# Patient Record
Sex: Female | Born: 1995 | Hispanic: No | Marital: Single | Smoking: Never smoker
Health system: Southern US, Community
[De-identification: ages and names within clinical notes are randomized; demographics above are authoritative.]

---

## 2021-05-06 ENCOUNTER — Encounter (HOSPITAL_COMMUNITY): Payer: Self-pay | Admitting: Emergency Medicine

## 2021-05-06 ENCOUNTER — Emergency Department (HOSPITAL_COMMUNITY): Payer: Medicaid Other

## 2021-05-06 ENCOUNTER — Emergency Department (HOSPITAL_COMMUNITY)
Admission: EM | Admit: 2021-05-06 | Discharge: 2021-05-07 | Disposition: A | Discharge status: Home / Self Care | Payer: Medicaid Other | Attending: Obstetrics and Gynecology | Admitting: Obstetrics and Gynecology

## 2021-05-06 ENCOUNTER — Other Ambulatory Visit: Payer: Self-pay

## 2021-05-06 DIAGNOSIS — O26891 Other specified pregnancy related conditions, first trimester: Secondary | ICD-10-CM

## 2021-05-06 DIAGNOSIS — O23591 Infection of other part of genital tract in pregnancy, first trimester: Secondary | ICD-10-CM | POA: Diagnosis not present

## 2021-05-06 DIAGNOSIS — N760 Acute vaginitis: Secondary | ICD-10-CM

## 2021-05-06 DIAGNOSIS — O99891 Other specified diseases and conditions complicating pregnancy: Secondary | ICD-10-CM | POA: Diagnosis not present

## 2021-05-06 DIAGNOSIS — R110 Nausea: Secondary | ICD-10-CM

## 2021-05-06 DIAGNOSIS — N898 Other specified noninflammatory disorders of vagina: Secondary | ICD-10-CM

## 2021-05-06 DIAGNOSIS — R109 Unspecified abdominal pain: Secondary | ICD-10-CM | POA: Diagnosis not present

## 2021-05-06 DIAGNOSIS — B9689 Other specified bacterial agents as the cause of diseases classified elsewhere: Secondary | ICD-10-CM

## 2021-05-06 DIAGNOSIS — R102 Pelvic and perineal pain: Secondary | ICD-10-CM | POA: Diagnosis not present

## 2021-05-06 DIAGNOSIS — R1031 Right lower quadrant pain: Secondary | ICD-10-CM | POA: Diagnosis not present

## 2021-05-06 DIAGNOSIS — O210 Mild hyperemesis gravidarum: Secondary | ICD-10-CM | POA: Insufficient documentation

## 2021-05-06 DIAGNOSIS — Z3A09 9 weeks gestation of pregnancy: Secondary | ICD-10-CM

## 2021-05-06 DIAGNOSIS — O219 Vomiting of pregnancy, unspecified: Secondary | ICD-10-CM

## 2021-05-06 LAB — WET PREP, GENITAL
Sperm: NONE SEEN
Trich, Wet Prep: NONE SEEN
Yeast Wet Prep HPF POC: NONE SEEN

## 2021-05-06 LAB — CBC
HCT: 40.4 % (ref 36.0–46.0)
Hemoglobin: 14 g/dL (ref 12.0–15.0)
MCH: 29.1 pg (ref 26.0–34.0)
MCHC: 34.7 g/dL (ref 30.0–36.0)
MCV: 84 fL (ref 80.0–100.0)
Platelets: 367 10*3/uL (ref 150–400)
RBC: 4.81 MIL/uL (ref 3.87–5.11)
RDW: 13 % (ref 11.5–15.5)
WBC: 14.4 10*3/uL — ABNORMAL HIGH (ref 4.0–10.5)
nRBC: 0 % (ref 0.0–0.2)

## 2021-05-06 LAB — COMPREHENSIVE METABOLIC PANEL
ALT: 26 U/L (ref 0–44)
AST: 24 U/L (ref 15–41)
Albumin: 3.4 g/dL — ABNORMAL LOW (ref 3.5–5.0)
Alkaline Phosphatase: 100 U/L (ref 38–126)
Anion gap: 8 (ref 5–15)
BUN: 6 mg/dL (ref 6–20)
CO2: 24 mmol/L (ref 22–32)
Calcium: 9.2 mg/dL (ref 8.9–10.3)
Chloride: 101 mmol/L (ref 98–111)
Creatinine, Ser: 0.51 mg/dL (ref 0.44–1.00)
GFR, Estimated: >60 mL/min (ref >60)
Glucose, Bld: 93 mg/dL (ref 70–99)
Potassium: 4.1 mmol/L (ref 3.5–5.1)
Sodium: 133 mmol/L — ABNORMAL LOW (ref 135–145)
Total Bilirubin: 0.5 mg/dL (ref 0.3–1.2)
Total Protein: 6.4 g/dL — ABNORMAL LOW (ref 6.5–8.1)

## 2021-05-06 LAB — I-STAT BETA HCG BLOOD, ED (MC, WL, AP ONLY): I-stat hCG, quantitative: >2000 m[IU]/mL — ABNORMAL HIGH (ref <5)

## 2021-05-06 MED ORDER — DOXYLAMINE-PYRIDOXINE 10-10 MG PO TBEC: 2.0000 | DELAYED_RELEASE_TABLET | Freq: Every evening | ORAL | 1 refills | Status: DC | PRN

## 2021-05-06 MED ORDER — METRONIDAZOLE 500 MG PO TABS: 500.0000 mg | ORAL_TABLET | Freq: Two times a day (BID) | ORAL | 0 refills | Status: AC

## 2021-05-06 NOTE — ED Provider Notes (Signed)
Emergency Medicine Provider OB Triage Evaluation Note  Virginia Greene is a 25 y.o. female, No obstetric history on file., at Unknown gestation who presents to the emergency department with complaints of vaginal dc and pain. She believes she is 2 months pregnant. Positive home test.  Review of  Systems  Positive: pelvic pain Negative: fever  Physical Exam  There were no vitals taken for this visit. General: Awake, no distress  HEENT: Atraumatic  Resp: Normal effort  Cardiac: Normal rate Abd: Nondistended, nontender  MSK: Moves all extremities without difficulty Neuro: Speech clear  Medical Decision Making  Pt evaluated for pregnancy concern and is stable for transfer to MAU. Pt is in agreement with plan for transfer.  8:00 PM Discussed with MAU APP, Thalia Bloodgood, who accepts patient in transfer.  Clinical Impression  No diagnosis found.  HCG pos- sending to MAU   Arthor Captain, PA-C 05/06/21 2109    Melene Plan, DO 05/06/21 2204

## 2021-05-06 NOTE — MAU Note (Signed)
Pt instructed in getting vag swabs and obtained them without difficulty.

## 2021-05-06 NOTE — ED Notes (Signed)
Report given to MAU RN for transfer , patient evaluated by PA at triage .

## 2021-05-06 NOTE — Discharge Instructions (Signed)
[facility] Providers   [facility]             [address], [place], [place] [address] [phone]  [facility]                                                             [address], [address], [place], [place], [address] [phone]  [facility]                                    [address], [address], , [place], [address] [phone]  [facility] [place] [address], [place], [place], [address] [phone]  [facility]                                 [address], [place], [place], [address] [phone]  [facility]                                    [address], [place], [place], [address] [phone]  [facility] [address], [address], [place], [place], [address]                              [facility] [address], [place], [place], [address] [phone]  [facility]         Phone: [phone]  [facility]      Phone: [phone]   [facility]      Phone: [phone]  [facility]         Phone: [phone]   [facility]    Phone: [phone]  [facility]      Phone: [phone]  [facility]     Phone: [phone]  [facility]        Phone: [phone]  [facility]      Phone: [phone]  

## 2021-05-06 NOTE — ED Triage Notes (Signed)
Patient reports mid abdominal  pain this evening , no emesis or diarrhea , she is approx. 2 months pregnant , no vaginal bleeding , positive home preg test .

## 2021-05-06 NOTE — MAU Note (Signed)
Wynelle Bourgeois CNM in Triage to discuss test results and d/c plan with pt. Pt then d/c home from Triage by CNM

## 2021-05-06 NOTE — MAU Provider Note (Signed)
Chief Complaint: Abdominal Pian / Pregnant   None    SUBJECTIVE HPI: Virginia Greene is a 25 y.o. G2P1001 at [redacted]w[redacted]d by LMP who presents to maternity admissions reporting pain in right abdomen which has now gone away.  Had the pain for several hours and then it resolved.  Has had some nausea. . She denies vaginal bleeding, vaginal itching/burning, urinary symptoms, h/a, dizziness, or fever/chills.    Abdominal Pain This is a new problem. The current episode started today. The most recent episode lasted 3 hours. The problem has been resolved. The pain is located in the RLQ. The pain is mild. The quality of the pain is cramping. The abdominal pain does not radiate. Associated symptoms include nausea. Pertinent negatives include no constipation, diarrhea, dysuria, frequency, headaches, myalgias or vomiting. Nothing aggravates the pain. The pain is relieved by Nothing. She has tried nothing for the symptoms.  RN Note: Pt came to [facility] ED having abd pain mid R abdomen for 3 hrs. Pain comes and goes and is sharp. Some n/v but not diarrhea. Denies VB. Some white vag d/c.   History reviewed. No pertinent past medical history. History reviewed. No pertinent surgical history. Social History   Socioeconomic History   Marital status: Single    Spouse name: Not on file   Number of children: Not on file   Years of education: Not on file   Highest education level: Not on file  Occupational History   Not on file  Tobacco Use   Smoking status: Never   Smokeless tobacco: Not on file  Substance and Sexual Activity   Alcohol use: Never   Drug use: Never   Sexual activity: Not on file  Other Topics Concern   Not on file  Social History Narrative   Not on file   Social Determinants of Health   Financial Resource Strain: Not on file  Food Insecurity: Not on file  Transportation Needs: Not on file  Physical Activity: Not on file  Stress: Not on file  Social Connections: Not on file  Intimate  Partner Violence: Not on file   No current facility-administered medications on file prior to encounter.   No current outpatient medications on file prior to encounter.   No Known Allergies  I have reviewed patient's Past Medical Hx, Surgical Hx, Family Hx, Social Hx, medications and allergies.   ROS:  Review of Systems  Gastrointestinal:  Positive for abdominal pain and nausea. Negative for constipation, diarrhea and vomiting.  Genitourinary:  Negative for dysuria and frequency.  Musculoskeletal:  Negative for myalgias.  Neurological:  Negative for headaches.  Review of Systems  Other systems negative   Physical Exam  Physical Exam Patient Vitals for the past 24 hrs:  BP Temp Pulse Resp Height Weight  05/06/21 2136 130/80 -- 96 -- -- --  05/06/21 2133 -- 98.4 F (36.9 C) -- 16 4\' 11"  (1.499 m) 61.7 kg  05/06/21 2001 -- -- -- -- 4\' 11"  (1.499 m) 70 kg   Constitutional: Well-developed, well-nourished female in no acute distress.  Cardiovascular: normal rate Respiratory: normal effort GI: Abd soft, non-tender.  MS: Extremities nontender, no edema, normal ROM Neurologic: Alert and oriented x 4.  GU: Neg CVAT.  PELVIC EXAM: deferred, patient's pain resolved and she was managed from triage due to high patient volume on unit  LAB RESULTS Results for orders placed or performed during the hospital encounter of 05/06/21 (from the past 24 hour(s))  I-Stat beta hCG blood, ED  Status: Abnormal   Collection Time: 05/06/21  8:34 PM  Result Value Ref Range   I-stat hCG, quantitative >2,000.0 (H) <5 mIU/mL   Comment 3          CBC     Status: Abnormal   Collection Time: 05/06/21  9:50 PM  Result Value Ref Range   WBC 14.4 (H) 4.0 - 10.5 K/uL   RBC 4.81 3.87 - 5.11 MIL/uL   Hemoglobin 14.0 12.0 - 15.0 g/dL   HCT 32.4 40.1 - 02.7 %   MCV 84.0 80.0 - 100.0 fL   MCH 29.1 26.0 - 34.0 pg   MCHC 34.7 30.0 - 36.0 g/dL   RDW 25.3 66.4 - 40.3 %   Platelets 367 150 - 400 K/uL   nRBC  0.0 0.0 - 0.2 %  hCG, quantitative, pregnancy     Status: Abnormal   Collection Time: 05/06/21  9:50 PM  Result Value Ref Range   hCG, Beta Chain, Sharene Butters, S 47,425 (H) <5 mIU/mL  ABO/Rh     Status: None   Collection Time: 05/06/21  9:50 PM  Result Value Ref Range   ABO/RH(D)      B POS Performed at [facility], [address] [place], [place] [address]   Comprehensive metabolic panel     Status: Abnormal   Collection Time: 05/06/21  9:50 PM  Result Value Ref Range   Sodium 133 (L) 135 - 145 mmol/L   Potassium 4.1 3.5 - 5.1 mmol/L   Chloride 101 98 - 111 mmol/L   CO2 24 22 - 32 mmol/L   Glucose, Bld 93 70 - 99 mg/dL   BUN 6 6 - 20 mg/dL   Creatinine, Ser 7.56 0.44 - 1.00 mg/dL   Calcium 9.2 8.9 - 43.3 mg/dL   Total Protein 6.4 (L) 6.5 - 8.1 g/dL   Albumin 3.4 (L) 3.5 - 5.0 g/dL   AST 24 15 - 41 U/L   ALT 26 0 - 44 U/L   Alkaline Phosphatase 100 38 - 126 U/L   Total Bilirubin 0.5 0.3 - 1.2 mg/dL   GFR, Estimated >29 >51 mL/min   Anion gap 8 5 - 15  Wet prep, genital     Status: Abnormal   Collection Time: 05/06/21 11:19 PM  Result Value Ref Range   Yeast Wet Prep HPF POC NONE SEEN NONE SEEN   Trich, Wet Prep NONE SEEN NONE SEEN   Clue Cells Wet Prep HPF POC PRESENT (A) NONE SEEN   WBC, Wet Prep HPF POC MANY (A) NONE SEEN   Sperm NONE SEEN     --/--/B POS Performed at [facility], [address] [place], [place] [address]  [phone])  IMAGING US OB Comp Less 14 Wks  Result Date: 05/06/2021 CLINICAL DATA:  Pelvic pain EXAM: OBSTETRIC <14 WK ULTRASOUND TECHNIQUE: Transabdominal ultrasound was performed for evaluation of the gestation as well as the maternal uterus and adnexal regions. COMPARISON:  None. FINDINGS: Intrauterine gestational sac: Single Yolk sac:  Visualized. Embryo:  Visualized. Cardiac Activity: Visualized. Heart Rate: 180 bpm CRL: 24.9 mm   9 w 1 d                  [place] EDC: 12/08/2021 Subchorionic hemorrhage:  None visualized. Maternal  uterus/adnexae: Left ovary measures 1.3 x 2.7 x 1.4 cm and the right ovary measures 2.5 x 3.8 x 2.4 cm. No adnexal masses or free fluid. IMPRESSION: 1. Single live intrauterine pregnancy  as above, estimated age 10 weeks and 1 day. Electronically Signed   By: Sharlet Salina M.D.   On: 05/06/2021 23:23    MAU Management/MDM: Ordered usual first trimester r/o ectopic labs.   cultures done Will check baseline Ultrasound to rule out ectopic.  This bleeding/pain can represent a normal pregnancy with bleeding, spontaneous abortion or even an ectopic which can be life-threatening.  The process as listed above helps to determine which of these is present.  Reviewed results with patient Discussed prenatal care, wishes to go to [facility]   ASSESSMENT 1. Pelvic pain affecting pregnancy in first trimester, antepartum   2.    Single IUP at [redacted]w[redacted]d 3.     Nausea 4.     Bacterial vaginosis  PLAN Discharge home Rx Diclegis for nausea Rx Flagyl for BV Info for prenatal Care providers given  Pt stable at time of discharge. Encouraged to return here if she develops worsening of symptoms, increase in pain, fever, or other concerning symptoms.    Wynelle Bourgeois CNM, MSN Certified Nurse-Midwife 05/06/2021  11:50 PM

## 2021-05-06 NOTE — MAU Note (Signed)
Pt has here 21mo old daughter with her. Informed the patient the baby is not to be in the hospital per Covid policy. She states she can get the baby's father to pick up the baby. Pt did ask "so I will have to be in the hospital by myself?" Blood drawn and then pt will work on contacting the father

## 2021-05-06 NOTE — MAU Note (Signed)
Pt came to [facility] ED having abd pain mid R abdomen for 3 hrs. Pain comes and goes and is sharp. Some n/v but not diarrhea. Denies VB. Some white vag d/c.

## 2021-05-07 LAB — GC/CHLAMYDIA PROBE AMP (~~LOC~~) NOT AT ARMC
Chlamydia: NEGATIVE
Comment: NEGATIVE
Comment: NORMAL
Neisseria Gonorrhea: NEGATIVE

## 2021-05-07 LAB — ABO/RH: ABO/RH(D): B POS

## 2021-05-07 LAB — HCG, QUANTITATIVE, PREGNANCY: hCG, Beta Chain, Quant, S: 68873 m[IU]/mL — ABNORMAL HIGH (ref <5)

## 2021-07-06 ENCOUNTER — Ambulatory Visit (INDEPENDENT_AMBULATORY_CARE_PROVIDER_SITE_OTHER): Payer: Medicaid Other

## 2021-07-06 DIAGNOSIS — Z3A00 Weeks of gestation of pregnancy not specified: Secondary | ICD-10-CM

## 2021-07-06 DIAGNOSIS — Z3490 Encounter for supervision of normal pregnancy, unspecified, unspecified trimester: Secondary | ICD-10-CM

## 2021-07-06 DIAGNOSIS — Z3480 Encounter for supervision of other normal pregnancy, unspecified trimester: Secondary | ICD-10-CM

## 2021-07-06 MED ORDER — BLOOD PRESSURE KIT DEVI: 1.0000 | 0 refills | Status: AC | PRN

## 2021-07-06 MED ORDER — GOJJI WEIGHT SCALE MISC
1.0000 | 0 refills | Status: AC | PRN
Start: 2021-07-06 — End: ?

## 2021-07-06 NOTE — Progress Notes (Addendum)
..  New OB Intake  I connected with  Virginia Greene on 07/06/21 at  9:00 AM EDT by telephone Video Visit and verified that I am speaking with the correct person using two identifiers. Nurse is located at [facility] and pt is located at home.  I discussed the limitations, risks, security and privacy concerns of performing an evaluation and management service by telephone and the availability of in person appointments. I also discussed with the patient that there may be a patient responsible charge related to this service. The patient expressed understanding and agreed to proceed.  I explained I am completing New OB Intake today. We discussed her EDD of 12/10/2021 that is based on LMP of 03/05/21. Pt is G2/P1. I reviewed her allergies, medications, Medical/Surgical/OB history, and appropriate screenings. I informed her of [facility] services. Based on history, this is a/an  pregnancy uncomplicated .   Patient Active Problem List   Diagnosis Date Noted   Supervision of other normal pregnancy, antepartum 07/06/2021    Concerns addressed today  Delivery Plans:  Plans to deliver at [facility].   MyChart/Babyscripts MyChart access verified. I explained pt will have some visits in office and some virtually. Babyscripts instructions given and order placed. Patient verifies receipt of registration text/e-mail. Account successfully created and app downloaded.  Blood Pressure Cuff  Blood pressure cuff ordered for patient to pick-up from [employer]. Explained after first prenatal appt pt will check weekly and document in Babyscripts.  Weight scale: Patient does not have weight scale. Weight scale ordered for patient to pick up form [facility].   Anatomy US Explained first scheduled [place] will be around 19 weeks. Anatomy US appt to be determined.  Labs Discussed Avelina Laine genetic screening with patient. Would like both Panorama and Horizon drawn at new OB visit. Routine prenatal labs needed.  Covid  Vaccine Patient has covid vaccine.   Mother/ Baby Dyad Candidate?    If yes, offer as possibility  Informed patient of [facility] website and placed link in her AVS.   Social Determinants of Health Food Insecurity: Patient denies food insecurity. WIC Referral: Patient is not interested in referral to [facility].  Transportation: Patient denies transportation needs. Childcare: Discussed no children allowed at ultrasound appointments. Offered childcare services; patient declines childcare services at this time. Pt declined the need for counseling services based on PHQ-9.  Send link to Pregnancy Navigators   Placed OB Box on problem list and updated  First visit review I reviewed new OB appt with pt. I explained she will have a pelvic exam, ob bloodwork with genetic screening, and PAP smear. Explained pt will be seen by Dr. Clearance Coots at first visit; encounter routed to appropriate provider. Explained that patient will be seen by pregnancy navigator following visit with provider. [facility] information placed in AVS.   Katrina Stack, RN 07/06/2021  9:04 AM

## 2021-07-06 NOTE — Progress Notes (Signed)
Patient was assessed and managed by nursing staff during this encounter. I have reviewed the chart and agree with the documentation and plan. I have also made any necessary editorial changes.  Loras Grieshop A Krisha Beegle, MD 07/06/2021 12:21 PM   

## 2021-07-13 ENCOUNTER — Ambulatory Visit (INDEPENDENT_AMBULATORY_CARE_PROVIDER_SITE_OTHER): Payer: Medicaid Other | Admitting: Obstetrics

## 2021-07-13 ENCOUNTER — Other Ambulatory Visit (HOSPITAL_COMMUNITY)
Admission: RE | Admit: 2021-07-13 | Discharge: 2021-07-13 | Disposition: A | Discharge status: Home / Self Care | Payer: Medicaid Other | Source: Ambulatory Visit | Attending: Obstetrics | Admitting: Obstetrics

## 2021-07-13 ENCOUNTER — Other Ambulatory Visit: Payer: Self-pay

## 2021-07-13 ENCOUNTER — Encounter: Payer: Self-pay | Admitting: Obstetrics

## 2021-07-13 VITALS — BP 142/80 | HR 119 | Wt 138.0 lb

## 2021-07-13 DIAGNOSIS — Z3480 Encounter for supervision of other normal pregnancy, unspecified trimester: Secondary | ICD-10-CM | POA: Insufficient documentation

## 2021-07-13 MED ORDER — VALACYCLOVIR HCL 500 MG PO TABS: 500.0000 mg | ORAL_TABLET | ORAL | 11 refills | Status: DC | PRN

## 2021-07-13 MED ORDER — VITAFOL ULTRA 29-0.6-0.4-200 MG PO CAPS: 1.0000 | ORAL_CAPSULE | Freq: Every day | ORAL | 4 refills | Status: AC

## 2021-07-13 NOTE — Progress Notes (Signed)
Subjective:  Virginia Greene is a 25 y.o. G2P1001 at [redacted]w[redacted]d being seen today for ongoing prenatal care.  She is currently monitored for the following issues for this low-risk pregnancy and has Supervision of other normal pregnancy, antepartum on their problem list.  Patient reports no complaints.  Contractions: Not present. Vag. Bleeding: None.  Movement: Present. Denies leaking of fluid.   The following portions of the patient's history were reviewed and updated as appropriate: allergies, current medications, past family history, past medical history, past social history, past surgical history and problem list. Problem list updated.  Objective:   Vitals:   07/13/21 1032  BP: (!) 142/80  Pulse: (!) 119    Fetal Status:     Movement: Present     General:  Alert, oriented and cooperative. Patient is in no acute distress.  Skin: Skin is warm and dry. No rash noted.   Cardiovascular: Normal heart rate noted  Respiratory: Normal respiratory effort, no problems with respiration noted  Abdomen: Soft, gravid, appropriate for gestational age. Pain/Pressure: Present     Pelvic:  Cervical exam deferred        Extremities: Normal range of motion.  Edema: None  Mental Status: Normal mood and affect. Normal behavior. Normal judgment and thought content.   Urinalysis:      Assessment and Plan:  Pregnancy: G2P1001 at [redacted]w[redacted]d  1. Supervision of other normal pregnancy, antepartum Rx: - Cytology - PAP( [place]) - Cervicovaginal ancillary only( [place]) - Obstetric Panel, Including HIV - Hepatitis C antibody - Culture, OB Urine - valACYclovir (VALTREX) 500 MG tablet; Take 1 tablet (500 mg total) by mouth as needed.  Dispense: 30 tablet; Refill: 11 - Genetic Screening    Preterm labor symptoms and general obstetric precautions including but not limited to vaginal bleeding, contractions, leaking of fluid and fetal movement were reviewed in detail with the patient. Please refer to After  Visit Summary for other counseling recommendations.   Return in about 4 weeks (around 08/10/2021) for ROB at [facility] office, per patient request.   Brock Bad, MD  07/13/21

## 2021-07-14 LAB — OBSTETRIC PANEL, INCLUDING HIV
Antibody Screen: NEGATIVE
Basophils Absolute: 0 10*3/uL (ref 0.0–0.2)
Basos: 0 %
EOS (ABSOLUTE): 0 10*3/uL (ref 0.0–0.4)
Eos: 0 %
HIV Screen 4th Generation wRfx: NONREACTIVE
Hematocrit: 37 % (ref 34.0–46.6)
Hemoglobin: 12.9 g/dL (ref 11.1–15.9)
Hepatitis B Surface Ag: NEGATIVE
Immature Grans (Abs): 0 10*3/uL (ref 0.0–0.1)
Immature Granulocytes: 0 %
Lymphocytes Absolute: 2.6 10*3/uL (ref 0.7–3.1)
Lymphs: 19 %
MCH: 29.1 pg (ref 26.6–33.0)
MCHC: 34.9 g/dL (ref 31.5–35.7)
MCV: 84 fL (ref 79–97)
Monocytes Absolute: 0.4 10*3/uL (ref 0.1–0.9)
Monocytes: 3 %
Neutrophils Absolute: 10.4 10*3/uL — ABNORMAL HIGH (ref 1.4–7.0)
Neutrophils: 78 %
Platelets: 397 10*3/uL (ref 150–450)
RBC: 4.43 x10E6/uL (ref 3.77–5.28)
RDW: 13.1 % (ref 11.7–15.4)
RPR Ser Ql: NONREACTIVE
Rh Factor: POSITIVE
Rubella Antibodies, IGG: 2.36 index (ref >0.99)
WBC: 13.5 10*3/uL — ABNORMAL HIGH (ref 3.4–10.8)

## 2021-07-14 LAB — CERVICOVAGINAL ANCILLARY ONLY
Bacterial Vaginitis (gardnerella): NEGATIVE
Candida Glabrata: NEGATIVE
Candida Vaginitis: NEGATIVE
Chlamydia: NEGATIVE
Comment: NEGATIVE
Comment: NEGATIVE
Comment: NEGATIVE
Comment: NEGATIVE
Comment: NEGATIVE
Comment: NORMAL
Neisseria Gonorrhea: NEGATIVE
Trichomonas: NEGATIVE

## 2021-07-14 LAB — HEPATITIS C ANTIBODY: Hep C Virus Ab: <0.1 s/co ratio (ref 0.0–0.9)

## 2021-07-15 LAB — CYTOLOGY - PAP: Diagnosis: NEGATIVE

## 2021-07-18 LAB — URINE CULTURE, OB REFLEX

## 2021-07-18 LAB — CULTURE, OB URINE

## 2021-07-19 ENCOUNTER — Other Ambulatory Visit: Payer: Self-pay | Admitting: Obstetrics

## 2021-07-19 ENCOUNTER — Encounter: Payer: Self-pay | Admitting: Obstetrics

## 2021-07-19 DIAGNOSIS — O2342 Unspecified infection of urinary tract in pregnancy, second trimester: Secondary | ICD-10-CM

## 2021-07-19 MED ORDER — CEFUROXIME AXETIL 500 MG PO TABS: 500.0000 mg | ORAL_TABLET | Freq: Two times a day (BID) | ORAL | 0 refills | Status: DC

## 2021-07-20 ENCOUNTER — Telehealth: Payer: Self-pay

## 2021-07-20 NOTE — Telephone Encounter (Signed)
Patient labs show UTI, RX Ceftin sent to patient pharmacy on file.Unsuccessful called to patient to make her aware of test results.

## 2021-07-22 ENCOUNTER — Ambulatory Visit: Payer: Medicaid Other

## 2021-07-22 ENCOUNTER — Encounter: Payer: Self-pay | Admitting: Obstetrics

## 2021-07-29 ENCOUNTER — Ambulatory Visit: Payer: Medicaid Other

## 2021-08-06 ENCOUNTER — Other Ambulatory Visit: Payer: Medicaid Other

## 2021-08-06 ENCOUNTER — Ambulatory Visit: Payer: Medicaid Other

## 2021-08-10 ENCOUNTER — Encounter: Payer: Medicaid Other | Admitting: Advanced Practice Midwife

## 2021-08-18 ENCOUNTER — Ambulatory Visit: Payer: Medicaid Other

## 2021-08-18 ENCOUNTER — Ambulatory Visit: Payer: Medicaid Other | Attending: Obstetrics

## 2021-09-07 ENCOUNTER — Encounter: Payer: Medicaid Other | Admitting: Advanced Practice Midwife

## 2021-09-21 ENCOUNTER — Other Ambulatory Visit: Payer: Self-pay

## 2021-09-21 ENCOUNTER — Ambulatory Visit (INDEPENDENT_AMBULATORY_CARE_PROVIDER_SITE_OTHER): Payer: Medicaid Other | Admitting: Advanced Practice Midwife

## 2021-09-21 VITALS — BP 122/70 | HR 106 | Wt 150.0 lb

## 2021-09-21 DIAGNOSIS — Z3483 Encounter for supervision of other normal pregnancy, third trimester: Secondary | ICD-10-CM | POA: Diagnosis not present

## 2021-09-21 DIAGNOSIS — Z23 Encounter for immunization: Secondary | ICD-10-CM | POA: Diagnosis not present

## 2021-09-21 DIAGNOSIS — Z3480 Encounter for supervision of other normal pregnancy, unspecified trimester: Secondary | ICD-10-CM

## 2021-09-21 DIAGNOSIS — Z3A28 28 weeks gestation of pregnancy: Secondary | ICD-10-CM

## 2021-09-21 DIAGNOSIS — O0933 Supervision of pregnancy with insufficient antenatal care, third trimester: Secondary | ICD-10-CM

## 2021-09-21 DIAGNOSIS — O0930 Supervision of pregnancy with insufficient antenatal care, unspecified trimester: Secondary | ICD-10-CM

## 2021-09-21 NOTE — Progress Notes (Signed)
° °  PRENATAL VISIT NOTE  Subjective:  Virginia Greene is a 25 y.o. G2P1001 at [redacted]w[redacted]d being seen today for ongoing prenatal care.  She is currently monitored for the following issues for this low-risk pregnancy and has Supervision of other normal pregnancy, antepartum and Insufficient prenatal care on their problem list.  Patient reports no complaints.  Contractions: Not present. Vag. Bleeding: None.  Movement: Present. Denies leaking of fluid.   The following portions of the patient's history were reviewed and updated as appropriate: allergies, current medications, past family history, past medical history, past social history, past surgical history and problem list.   Objective:   Vitals:   09/21/21 1050  BP: 122/70  Pulse: (!) 106  Weight: 150 lb (68 kg)    Fetal Status: Fetal Heart Rate (bpm): 153   Movement: Present     General:  Alert, oriented and cooperative. Patient is in no acute distress.  Skin: Skin is warm and dry. No rash noted.   Cardiovascular: Normal heart rate noted  Respiratory: Normal respiratory effort, no problems with respiration noted  Abdomen: Soft, gravid, appropriate for gestational age.  Pain/Pressure: Present     Pelvic: Cervical exam deferred        Extremities: Normal range of motion.  Edema: Trace  Mental Status: Normal mood and affect. Normal behavior. Normal judgment and thought content.   Assessment and Plan:  Pregnancy: G2P1001 at [redacted]w[redacted]d 1. [redacted] weeks gestation of pregnancy  - Tdap vaccine greater than or equal to 7yo IM - Flu Vaccine QUAD 36+ mos IM (Fluarix, Quad PF)  2. Supervision of other normal pregnancy, antepartum      Will bring her back for Glucola test - Tdap vaccine greater than or equal to 7yo IM - Flu Vaccine QUAD 36+ mos IM (Fluarix, Quad PF)  3. Late prenatal care     States had major transportation issues.  Had one visit with Dr Clearance Coots in October. Missed [place] appts for same reason    Referred to Medicaid transportation  4.  Insufficient prenatal care in third trimester   Preterm labor symptoms and general obstetric precautions including but not limited to vaginal bleeding, contractions, leaking of fluid and fetal movement were reviewed in detail with the patient. Please refer to After Visit Summary for other counseling recommendations.     Future Appointments  Date Time Provider Department Center  09/29/2021  8:30 AM [facility] NURSE [facility] None  10/06/2021  9:35 AM [place], Rhona Raider, DO [facility] None    Wynelle Bourgeois, CNM

## 2021-09-29 ENCOUNTER — Other Ambulatory Visit: Payer: Medicaid Other

## 2021-10-06 ENCOUNTER — Encounter: Payer: Medicaid Other | Admitting: Family Medicine

## 2021-10-06 ENCOUNTER — Telehealth: Payer: Self-pay | Admitting: General Practice

## 2021-10-06 ENCOUNTER — Encounter: Payer: Self-pay | Admitting: General Practice

## 2021-10-06 NOTE — Telephone Encounter (Signed)
Pt missed appt for today.  Unable to reach patient via phone and tried calling both numbers that are on file.  Letter will be mailed.

## 2021-10-14 DIAGNOSIS — A6000 Herpesviral infection of urogenital system, unspecified: Secondary | ICD-10-CM | POA: Insufficient documentation

## 2021-10-14 DIAGNOSIS — R8271 Bacteriuria: Secondary | ICD-10-CM | POA: Insufficient documentation

## 2021-10-14 NOTE — Progress Notes (Signed)
° °  PRENATAL VISIT NOTE  Subjective:  Virginia Greene is a 26 y.o. G2P1001 at [redacted]w[redacted]d being seen today for ongoing prenatal care.  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  Patient reports  ongoing morning nausea. No help with diclegis. We will try reglan. She now has transportation and would like to go for her anatomy scan. Reviewed may not see all anatomy at this GA but reviewed still valuable .    Contractions: Not present. Vag. Bleeding: None.  Movement: Present. Denies leaking of fluid.   The following portions of the patient's history were reviewed and updated as appropriate: allergies, current medications, past family history, past medical history, past social history, past surgical history and problem list.   Objective:   Vitals:   10/15/21 0819  BP: 116/65  Pulse: 97  Weight: 152 lb (68.9 kg)    Fetal Status: Fetal Heart Rate (bpm): 135 Fundal Height: 32 cm Movement: Present     General:  Alert, oriented and cooperative. Patient is in no acute distress.  Skin: Skin is warm and dry. No rash noted.   Cardiovascular: Normal heart rate noted  Respiratory: Normal respiratory effort, no problems with respiration noted  Abdomen: Soft, gravid, appropriate for gestational age.  Pain/Pressure: Present     Pelvic: Cervical exam deferred        Extremities: Normal range of motion.  Edema: Trace  Mental Status: Normal mood and affect. Normal behavior. Normal judgment and thought content.   Assessment and Plan:  Pregnancy: G2P1001 at [redacted]w[redacted]d 1. Supervision of other normal pregnancy, antepartum 28wk labs today S/p tdap and flu  2. Insufficient prenatal care in third trimester Discussed lack of anatomy scan - pt would like to proceed now that she has a transportation through medicaid  3. GBS  bacteriuria PCN in labor  4. Genital herpes simplex, unspecified site Discussed valtrex at 36w - Rx sent now in anticipation  Preterm labor symptoms and general obstetric precautions including but not limited to vaginal bleeding, contractions, leaking of fluid and fetal movement were reviewed in detail with the patient. Please refer to After Visit Summary for other counseling recommendations.   Return in about 2 weeks (around 10/29/2021) for OB VISIT, MD or APP, Schedule [place] with MFM.  Future Appointments  Date Time Provider [place]  10/26/2021 10:35 AM Seabron Spates, CNM [facility] None  11/09/2021  9:55 AM Seabron Spates, CNM [facility] None  11/16/2021  9:55 AM Seabron Spates, CNM [facility] None  11/23/2021  9:55 AM Seabron Spates, CNM [facility] None  11/30/2021  9:35 AM Gavin Pound, CNM [facility] None  12/06/2021  9:55 AM Radene Gunning, MD [facility] None     Radene Gunning, MD

## 2021-10-15 ENCOUNTER — Encounter: Payer: Self-pay | Admitting: General Practice

## 2021-10-15 ENCOUNTER — Other Ambulatory Visit: Payer: Self-pay

## 2021-10-15 ENCOUNTER — Encounter: Payer: Self-pay | Admitting: Obstetrics and Gynecology

## 2021-10-15 ENCOUNTER — Ambulatory Visit (INDEPENDENT_AMBULATORY_CARE_PROVIDER_SITE_OTHER): Payer: Medicaid Other | Admitting: Obstetrics and Gynecology

## 2021-10-15 VITALS — BP 116/65 | HR 97 | Wt 152.0 lb

## 2021-10-15 DIAGNOSIS — O0933 Supervision of pregnancy with insufficient antenatal care, third trimester: Secondary | ICD-10-CM

## 2021-10-15 DIAGNOSIS — R8271 Bacteriuria: Secondary | ICD-10-CM

## 2021-10-15 DIAGNOSIS — Z3480 Encounter for supervision of other normal pregnancy, unspecified trimester: Secondary | ICD-10-CM

## 2021-10-15 DIAGNOSIS — Z3A32 32 weeks gestation of pregnancy: Secondary | ICD-10-CM

## 2021-10-15 DIAGNOSIS — A6000 Herpesviral infection of urogenital system, unspecified: Secondary | ICD-10-CM

## 2021-10-15 MED ORDER — METOCLOPRAMIDE HCL 5 MG PO TABS: 5.0000 mg | ORAL_TABLET | Freq: Four times a day (QID) | ORAL | 1 refills | Status: AC

## 2021-10-15 MED ORDER — VALACYCLOVIR HCL 500 MG PO TABS: 500.0000 mg | ORAL_TABLET | Freq: Two times a day (BID) | ORAL | 1 refills | Status: DC

## 2021-10-16 LAB — CBC
Hematocrit: 35.1 % (ref 34.0–46.6)
Hemoglobin: 12.1 g/dL (ref 11.1–15.9)
MCH: 29.4 pg (ref 26.6–33.0)
MCHC: 34.5 g/dL (ref 31.5–35.7)
MCV: 85 fL (ref 79–97)
Platelets: 386 10*3/uL (ref 150–450)
RBC: 4.12 x10E6/uL (ref 3.77–5.28)
RDW: 13.2 % (ref 11.7–15.4)
WBC: 11.8 10*3/uL — ABNORMAL HIGH (ref 3.4–10.8)

## 2021-10-16 LAB — GLUCOSE TOLERANCE, 2 HOURS W/ 1HR
Glucose, 1 hour: 127 mg/dL (ref 70–179)
Glucose, 2 hour: 88 mg/dL (ref 70–152)
Glucose, Fasting: 71 mg/dL (ref 70–91)

## 2021-10-16 LAB — RPR: RPR Ser Ql: NONREACTIVE

## 2021-10-16 LAB — HIV ANTIBODY (ROUTINE TESTING W REFLEX): HIV Screen 4th Generation wRfx: NONREACTIVE

## 2021-10-26 ENCOUNTER — Encounter: Payer: Self-pay | Admitting: Advanced Practice Midwife

## 2021-10-26 ENCOUNTER — Ambulatory Visit (INDEPENDENT_AMBULATORY_CARE_PROVIDER_SITE_OTHER): Payer: Medicaid Other | Admitting: Advanced Practice Midwife

## 2021-10-26 ENCOUNTER — Other Ambulatory Visit: Payer: Self-pay

## 2021-10-26 VITALS — BP 112/68 | HR 90 | Wt 154.0 lb

## 2021-10-26 DIAGNOSIS — Z3480 Encounter for supervision of other normal pregnancy, unspecified trimester: Secondary | ICD-10-CM

## 2021-10-26 DIAGNOSIS — O219 Vomiting of pregnancy, unspecified: Secondary | ICD-10-CM

## 2021-10-26 NOTE — Progress Notes (Signed)
° °  PRENATAL VISIT NOTE  Subjective:  Virginia Greene is a 26 y.o. G2P1001 at [redacted]w[redacted]d being seen today for ongoing prenatal care.  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  Patient reports nausea.  Contractions: Irritability. Vag. Bleeding: None.  Movement: Present. Denies leaking of fluid.   The following portions of the patient's history were reviewed and updated as appropriate: allergies, current medications, past family history, past medical history, past social history, past surgical history and problem list.   Objective:   Vitals:   10/26/21 1015  BP: 112/68  Pulse: 90  Weight: 154 lb (69.9 kg)    Fetal Status: Fetal Heart Rate (bpm): 133   Movement: Present     General:  Alert, oriented and cooperative. Patient is in no acute distress.  Skin: Skin is warm and dry. No rash noted.   Cardiovascular: Normal heart rate noted  Respiratory: Normal respiratory effort, no problems with respiration noted  Abdomen: Soft, gravid, appropriate for gestational age.  Pain/Pressure: Present     Pelvic: Cervical exam deferred        Extremities: Normal range of motion.  Edema: Trace  Mental Status: Normal mood and affect. Normal behavior. Normal judgment and thought content.   Assessment and Plan:  Pregnancy: G2P1001 at [redacted]w[redacted]d 1. Supervision of other normal pregnancy, antepartum     Glucola normal       Good FM, some BH but no PTL  2. Nausea and vomiting during pregnancy     States med is helping  Preterm labor symptoms and general obstetric precautions including but not limited to vaginal bleeding, contractions, leaking of fluid and fetal movement were reviewed in detail with the patient. Please refer to After Visit Summary for other counseling recommendations.   Return in about 2 weeks (around 11/09/2021)  for [facility].  Future Appointments  Date Time Provider [place]  10/27/2021 10:30 AM [facility] NURSE [facility]  10/27/2021 10:45 AM [facility] US5 [facility]  11/09/2021  9:55 AM Seabron Spates, CNM [facility] None  11/16/2021  9:55 AM Seabron Spates, CNM [facility] None  11/23/2021  9:55 AM Seabron Spates, CNM [facility] None  11/30/2021  9:35 AM Gavin Pound, CNM [facility] None  12/06/2021  9:55 AM Radene Gunning, MD [facility] None    Hansel Feinstein, CNM

## 2021-10-27 ENCOUNTER — Encounter: Payer: Self-pay | Admitting: *Deleted

## 2021-10-27 ENCOUNTER — Ambulatory Visit: Payer: Medicaid Other | Attending: Obstetrics

## 2021-10-27 ENCOUNTER — Ambulatory Visit: Payer: Medicaid Other | Admitting: *Deleted

## 2021-10-27 VITALS — BP 137/65 | HR 93

## 2021-10-27 DIAGNOSIS — Z3689 Encounter for other specified antenatal screening: Secondary | ICD-10-CM | POA: Insufficient documentation

## 2021-10-27 DIAGNOSIS — Z3A33 33 weeks gestation of pregnancy: Secondary | ICD-10-CM | POA: Insufficient documentation

## 2021-10-27 DIAGNOSIS — Z363 Encounter for antenatal screening for malformations: Secondary | ICD-10-CM | POA: Diagnosis present

## 2021-10-27 DIAGNOSIS — Z3480 Encounter for supervision of other normal pregnancy, unspecified trimester: Secondary | ICD-10-CM

## 2021-10-27 DIAGNOSIS — O0933 Supervision of pregnancy with insufficient antenatal care, third trimester: Secondary | ICD-10-CM | POA: Diagnosis not present

## 2021-11-09 ENCOUNTER — Encounter: Payer: Self-pay | Admitting: General Practice

## 2021-11-09 ENCOUNTER — Encounter: Payer: Medicaid Other | Admitting: Advanced Practice Midwife

## 2021-11-16 ENCOUNTER — Encounter: Payer: Medicaid Other | Admitting: Advanced Practice Midwife

## 2021-11-23 ENCOUNTER — Encounter: Payer: Medicaid Other | Admitting: Advanced Practice Midwife

## 2021-12-01 ENCOUNTER — Other Ambulatory Visit (HOSPITAL_COMMUNITY)
Admission: RE | Admit: 2021-12-01 | Discharge: 2021-12-01 | Disposition: A | Discharge status: Home / Self Care | Payer: Medicaid Other | Source: Ambulatory Visit | Attending: Obstetrics & Gynecology | Admitting: Obstetrics & Gynecology

## 2021-12-01 ENCOUNTER — Encounter: Payer: Self-pay | Admitting: General Practice

## 2021-12-01 ENCOUNTER — Other Ambulatory Visit: Payer: Self-pay

## 2021-12-01 ENCOUNTER — Encounter: Payer: Self-pay | Admitting: Obstetrics & Gynecology

## 2021-12-01 ENCOUNTER — Ambulatory Visit (INDEPENDENT_AMBULATORY_CARE_PROVIDER_SITE_OTHER): Payer: Medicaid Other | Admitting: Obstetrics & Gynecology

## 2021-12-01 VITALS — BP 128/77 | HR 90 | Wt 158.0 lb

## 2021-12-01 DIAGNOSIS — O0933 Supervision of pregnancy with insufficient antenatal care, third trimester: Secondary | ICD-10-CM | POA: Diagnosis not present

## 2021-12-01 DIAGNOSIS — O98313 Other infections with a predominantly sexual mode of transmission complicating pregnancy, third trimester: Secondary | ICD-10-CM | POA: Diagnosis not present

## 2021-12-01 DIAGNOSIS — Z3A38 38 weeks gestation of pregnancy: Secondary | ICD-10-CM | POA: Insufficient documentation

## 2021-12-01 DIAGNOSIS — A6000 Herpesviral infection of urogenital system, unspecified: Secondary | ICD-10-CM

## 2021-12-01 DIAGNOSIS — Z3480 Encounter for supervision of other normal pregnancy, unspecified trimester: Secondary | ICD-10-CM

## 2021-12-01 DIAGNOSIS — O0930 Supervision of pregnancy with insufficient antenatal care, unspecified trimester: Secondary | ICD-10-CM

## 2021-12-01 DIAGNOSIS — Z3483 Encounter for supervision of other normal pregnancy, third trimester: Secondary | ICD-10-CM | POA: Diagnosis present

## 2021-12-01 DIAGNOSIS — R8271 Bacteriuria: Secondary | ICD-10-CM

## 2021-12-01 NOTE — Progress Notes (Signed)
? ?  PRENATAL VISIT NOTE ? ?Subjective:  ?Virginia Greene is a 26 y.o. G2P1001 at [redacted]w[redacted]d being seen today for ongoing prenatal care.  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 ? ?Patient reports occasional contractions.  Contractions: Irritability. Vag. Bleeding: None.  Movement: Present. Denies leaking of fluid.  ? ?The following portions of the patient's history were reviewed and updated as appropriate: allergies, current medications, past family history, past medical history, past social history, past surgical history and problem list.  ? ?Objective:  ? ?Vitals:  ? 12/01/21 1412  ?BP: 128/77  ?Pulse: 90  ?Weight: 158 lb (71.7 kg)  ? ? ?Fetal Status:     Movement: Present    ? ?General:  Alert, oriented and cooperative. Patient is in no acute distress.  ?Skin: Skin is warm and dry. No rash noted.   ?Cardiovascular: Normal heart rate noted  ?Respiratory: Normal respiratory effort, no problems with respiration noted  ?Abdomen: Soft, gravid, appropriate for gestational age.  Pain/Pressure: Present     ?Pelvic: Cervical exam performed in the presence of a chaperone        ?Extremities: Normal range of motion.  Edema: Trace  ?Mental Status: Normal mood and affect. Normal behavior. Normal judgment and thought content.  ? ?Assessment and Plan:  ?Pregnancy: G2P1001 at [redacted]w[redacted]d ?1. [redacted] weeks gestation of pregnancy ?FH and FHR WNL ? ?2. GBS bacteriuria ?Discussed with pt. Info given. Needs atbx in labor.  ? ?3. Insufficient antepartum care ?D/w pt importance of OB care ? ?4. Genital herpes simplex, unspecified site ?On HSV suppression. Pt reports that she si taking it daily.  ? ?5. Supervision of other normal pregnancy, antepartum ? ? ?Term labor symptoms and general obstetric precautions including but not limited to vaginal bleeding, contractions,  leaking of fluid and fetal movement were reviewed in detail with the patient. ?Please refer to After Visit Summary for other counseling recommendations.  ? ?No follow-ups on file. ? ?Future Appointments  ?Date Time Provider Department Center  ?12/06/2021  9:55 AM Milas Hock, MD [facility] None  ? ? ?Willodean Rosenthal, MD ? ?

## 2021-12-01 NOTE — Patient Instructions (Signed)
Group B Streptococcus Infection During Pregnancy Group B Streptococcus (GBS) is a type of bacteria that is often found in healthy people. It is commonly found in the rectum, vagina, and intestines. In people who are healthy and not pregnant, the bacteria rarely cause serious illness or complications. However, women who test positive for GBS during pregnancy can pass the bacteria to the baby during childbirth. This can cause serious infection in the baby after birth. Women with GBS may also have infections during their pregnancy or soon after childbirth. The infections include urinary tract infections (UTIs) or infections of the uterus. GBS also increases a woman's risk of complications during pregnancy, such as early labor or delivery, miscarriage, or stillbirth. Routine testing for GBS is recommended for all pregnant women. What are the causes? This condition is caused by bacteria called Streptococcus agalactiae. What increases the risk? You may have a higher risk for GBS infection during pregnancy if you had one during a past pregnancy. What are the signs or symptoms? In most cases, GBS infection does not cause symptoms in pregnant women. If symptoms exist, they may include: Labor that starts before the 37th week of pregnancy. A UTI or bladder infection. This may cause a fever, frequent urination, or pain and burning during urination. Fever during labor. There can also be a rapid heartbeat in the mother or baby. Rare but serious symptoms of a GBS infection in women include: Blood infection (septicemia). This may cause fever, chills, or confusion. Lung infection (pneumonia). This may cause fever, chills, cough, rapid breathing, chest pain, or difficulty breathing. Bone, joint, skin, or soft tissue infection. How is this diagnosed? You may be screened for GBS between week 35 and week 37 of pregnancy. If you have symptoms of preterm labor, you may be screened earlier. This condition is diagnosed  based on lab test results from: A swab of fluid from the vagina and rectum. A urine sample. How is this treated? This condition is treated with antibiotic medicine. Antibiotic medicine may be given: To you when you go into labor, or as soon as your water breaks. The medicines will continue until after you give birth. If you are having a cesarean delivery, you do not need antibiotics unless your water has broken. To your baby, if he or she requires treatment. Your health care provider will check your baby to decide if he or she needs antibiotics to prevent a serious infection. Follow these instructions at home: Take over-the-counter and prescription medicines only as told by your health care provider. Take your antibiotic medicine as told by your health care provider. Do not stop taking the antibiotic even if you start to feel better. Keep all pre-birth (prenatal) visits and follow-up visits as told by your health care provider. This is important. Contact a health care provider if: You have pain or burning when you urinate. You have to urinate more often than usual. You have a fever or chills. You develop a bad-smelling vaginal discharge. Get help right away if: Your water breaks. You go into labor. You have severe pain in your abdomen. You have difficulty breathing. You have chest pain. These symptoms may represent a serious problem that is an emergency. Do not wait to see if the symptoms will go away. Get medical help right away. Call your local emergency services (911 in the [place] Do not drive yourself to the hospital. Summary GBS is a type of bacteria that is common in healthy people. During pregnancy, colonization with GBS can cause  serious complications for you or your baby. Your health care provider will screen you between 35 and 37 weeks of pregnancy to determine if you are colonized with GBS. If you are colonized with GBS during pregnancy, your health care provider will recommend  antibiotics through an IV during labor. After delivery, your baby will be evaluated for complications related to potential GBS infection and may require antibiotics to prevent a serious infection. This information is not intended to replace advice given to you by your health care provider. Make sure you discuss any questions you have with your health care provider. Document Revised: 07/21/2020 Document Reviewed: 04/15/2019 Elsevier Patient Education  2022 Elsevier Inc.  Surgery to Prevent Pregnancy Sterilization is surgery to prevent pregnancy. Sterilization is permanent. It should only be done if you are sure that you do not want to have children. For females, the fallopian tubes are either blocked or closed off. When the fallopian tubes are closed, the eggs that the ovaries release cannot enter the uterus, sperm cannot reach the eggs, and pregnancy is prevented. For males, the vas deferens is cut and then tied or burned (cauterized). The vas deferens is a tube that carries sperm from the testicles. This procedure prevents pregnancy by blocking sperm from going through the vas deferens and penis during ejaculation. Types of sterilization   For females, the surgeries include: Laparoscopic tubal ligation. In this surgery, the fallopian tubes are tied off, sealed with heat, or blocked with a clip, ring, or clamp. A small portion of each fallopian tube may also be removed. This surgery is done through several small cuts (incisions) with special instruments that are inserted into the abdomen. Postpartum tubal ligation. This is also called a mini-laparotomy. This surgery is done right after childbirth or 1 or 2 days after childbirth. In this surgery, the fallopian tubes are tied off, sealed with heat, or blocked with a clip, ring, or clamp. A small portion of each fallopian tube may also be removed. The surgery is done through a single incision in the abdomen. Tubal ligation during a C-section. In this  surgery, the fallopian tubes are tied off, sealed with heat, or blocked with a clip, ring, or clamp. A small portion of each fallopian tube may also be removed. The surgery is done at the same time as a C-section delivery. For males, the surgeries include: Incision vasectomy. In this surgery, one or two small incisions are made in the scrotum. The vas deferens will be pulled out of the scrotum and cut. The vas deferens will be tied off or sealed with heat and placed back into your scrotum. The incision will be closed with absorbable stitches (sutures). No scalpel vasectomy. In this surgery, a punctured opening is made in the scrotum. The vas deferens will be pulled out of the scrotum and cut. The vas deferens will be tied off or sealed with heat and placed back into your scrotum. The opening is small and will not require sutures. What are the benefits of sterilization? It is usually effective for a lifetime. The procedures are generally safe. For females, sterilization does not affect the hormones, like other types of birth control. Because of this, menstrual periods will not be affected. For both males and females, sexual desire and sexual performance will not be affected. What are the disadvantages of sterilization? Risks from the surgery Generally, sterilization is safe. Complications are rare. However, there are some risks. They include: Bleeding. Infection. Reaction to medicine used during the procedure. Injury to  surrounding organs. Risks after sterilization After a successful surgery, you may have other problems. Female sterilization risks may include: Failure of the procedure. Sterilization is nearly 100% effective, but it can fail. In rare cases, the fallopian tubes can grow back together over time. If this happens, a female will be able to get pregnant again. A higher risk of having an ectopic pregnancy. An ectopic pregnancy is a pregnancy that grows outside of the uterus. This kind of  pregnancy can lead to serious bleeding if it is not treated. Female sterilization risks may include: Bleeding and swelling of the scrotum. Failure of the procedure. There is a very small chance that the tied or cauterized ends of the vas deferens may reconnect (recanalization). If this happens, a female could still make a female pregnant. Other risks may include: A risk that you may change your mind and decide you want have children. Sterilization may be reversed, but a reversal is not always successful. Lack of protection against sexually transmitted infections (STIs). What happens during the procedure? The steps of the procedure depend on the type of sterilization you are having. The procedure may vary among health care providers and hospitals. Questions to ask your health care provider How effective are sterilization procedures? What type of procedure is right for me? Is it possible to reverse the procedure if I change my mind? What can I expect after the procedure? Where to find more information [employer]: Robo[employer].cy [place] Department of Health and Human Services: ConventionalMedicines.si [facility]: www.urologyhealth.org Summary Sterilization is surgery to prevent pregnancy. There are different types of sterilization surgeries. Sterilization may be reversed, but a reversal is not always successful. Sterilization does not protect against STIs. This information is not intended to replace advice given to you by your health care provider. Make sure you discuss any questions you have with your health care provider. Document Revised: 06/20/2020 Document Reviewed: 06/20/2020 Elsevier Patient Education  2022 [employer].

## 2021-12-03 LAB — CERVICOVAGINAL ANCILLARY ONLY
Chlamydia: NEGATIVE
Comment: NEGATIVE
Comment: NORMAL
Neisseria Gonorrhea: NEGATIVE

## 2021-12-05 NOTE — Progress Notes (Deleted)
? ?  PRENATAL VISIT NOTE ? ?Subjective:  ?Virginia Greene is a 26 y.o. G2P1001 at [redacted]w[redacted]d being seen today for ongoing prenatal care.  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 ? ?Patient reports {sx:14538}.   .  .   . Denies leaking of fluid.  ? ?The following portions of the patient's history were reviewed and updated as appropriate: allergies, current medications, past family history, past medical history, past social history, past surgical history and problem list.  ? ?Objective:  ?There were no vitals filed for this visit. ? ?Fetal Status:          ? ?General:  Alert, oriented and cooperative. Patient is in no acute distress.  ?Skin: Skin is warm and dry. No rash noted.   ?Cardiovascular: Normal heart rate noted  ?Respiratory: Normal respiratory effort, no problems with respiration noted  ?Abdomen: Soft, gravid, appropriate for gestational age.        ?Pelvic: {Blank single:19197::"Cervical exam performed in the presence of a chaperone","Cervical exam deferred"}        ?Extremities: Normal range of motion.     ?Mental Status: Normal mood and affect. Normal behavior. Normal judgment and thought content.  ? ?Assessment and Plan:  ?Pregnancy: G2P1001 at [redacted]w[redacted]d ?1. Genital herpes simplex, unspecified site ?- On HSV suppression ? ?2. Supervision of other normal pregnancy, antepartum ?- Overall completed anatomy given late GA on 1/25. Growth at that time was normal, 22%ile ?- Discussed timing of delivery - *** ? ?3. Insufficient prenatal care in third trimester ?- Had transportation barriers  ? ?4. GBS bacteriuria ?- ABX in labor ? ?{Blank single:19197::"Term","Preterm"} labor symptoms and general obstetric precautions including but not limited to vaginal bleeding, contractions, leaking of fluid and fetal movement were reviewed in detail  with the patient. ?Please refer to After Visit Summary for other counseling recommendations.  ? ?No follow-ups on file. ? ?Future Appointments  ?Date Time Provider Department Center  ?12/06/2021  9:55 AM Milas Hock, MD [facility] None  ? ? ?Milas Hock, MD ?

## 2021-12-06 ENCOUNTER — Encounter: Payer: Medicaid Other | Admitting: Obstetrics and Gynecology

## 2021-12-06 ENCOUNTER — Telehealth: Payer: Self-pay

## 2021-12-06 DIAGNOSIS — Z3480 Encounter for supervision of other normal pregnancy, unspecified trimester: Secondary | ICD-10-CM

## 2021-12-06 DIAGNOSIS — A6000 Herpesviral infection of urogenital system, unspecified: Secondary | ICD-10-CM

## 2021-12-06 DIAGNOSIS — R8271 Bacteriuria: Secondary | ICD-10-CM

## 2021-12-06 DIAGNOSIS — O0933 Supervision of pregnancy with insufficient antenatal care, third trimester: Secondary | ICD-10-CM

## 2021-12-06 NOTE — Telephone Encounter (Signed)
Called patient to let her know she missed her appointment this morning. Left message to call back and reschedule. Armandina Stammer RN  ?

## 2021-12-07 ENCOUNTER — Other Ambulatory Visit: Payer: Self-pay

## 2021-12-07 DIAGNOSIS — A6000 Herpesviral infection of urogenital system, unspecified: Secondary | ICD-10-CM

## 2021-12-07 DIAGNOSIS — Z3480 Encounter for supervision of other normal pregnancy, unspecified trimester: Secondary | ICD-10-CM

## 2021-12-07 MED ORDER — VALACYCLOVIR HCL 500 MG PO TABS: 500.0000 mg | ORAL_TABLET | Freq: Two times a day (BID) | ORAL | 1 refills | Status: AC

## 2021-12-07 NOTE — Progress Notes (Signed)
Patient called stating that due to unforseen circumstances she is currently having to stay in [facility].  ? ?Sent refill of Valtrex for patient to pharmacy there.  ? ?Patient advised to have an office/hospital close to sign records release to send her records to since she is 39.4 weeks today.  ? ?Patient reporting good fetal movement. Patient encouraged to come back to [place] for postpartum if she thinks she will be in the area. Patient states understanding. Armandina Stammer RN  ?

## 2022-12-18 IMAGING — US US OB COMP LESS 14 WK
1 series · 15 of 28 positions shown · non-contrast
Comparison: None.

CLINICAL DATA: Pelvic pain

EXAM:
OBSTETRIC <14 WK ULTRASOUND
TECHNIQUE: Transabdominal ultrasound was performed for evaluation of the
gestation as well as the maternal uterus and adnexal regions.

[Series 1: us ob comp less 14 wk · 15 of 39 slices shown]
[im 1/39]
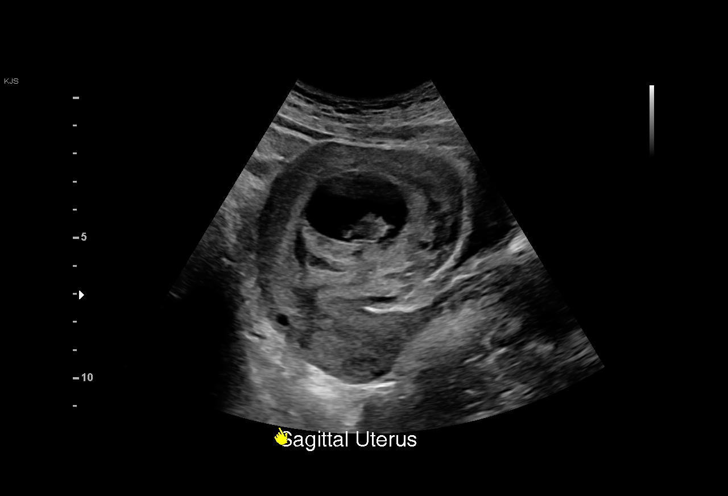
[im 3/39]
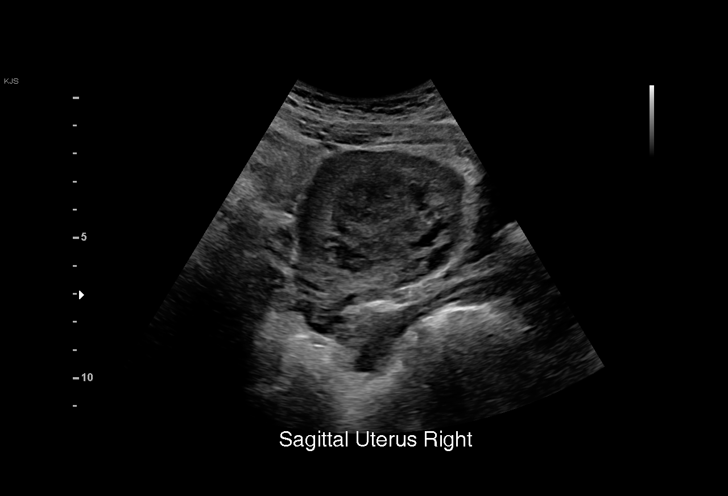
[im 6/39]
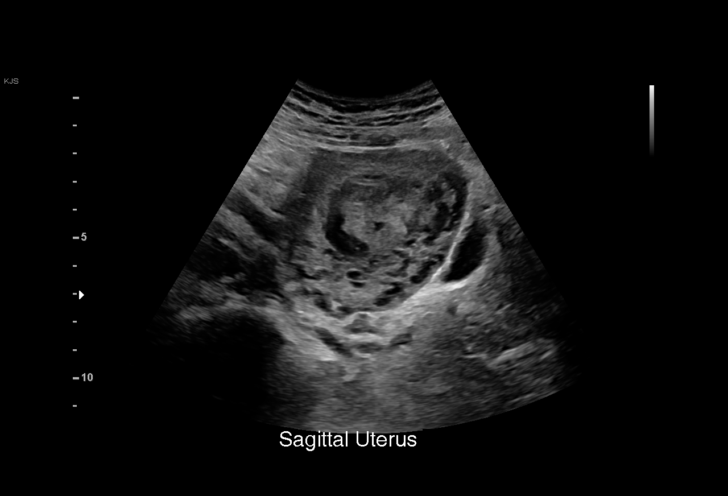
[im 9/39]
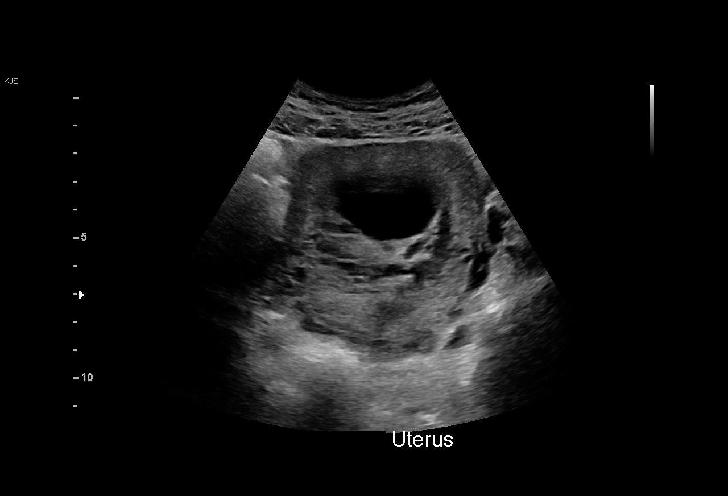
[im 12/39]
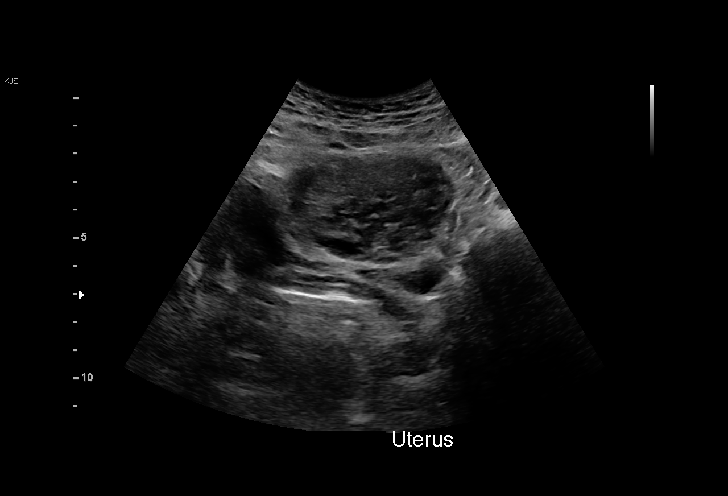
[im 15/39]
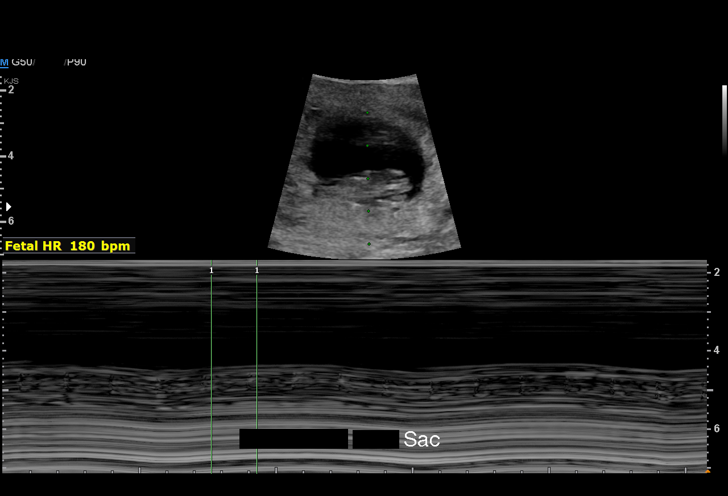
[im 17/39]
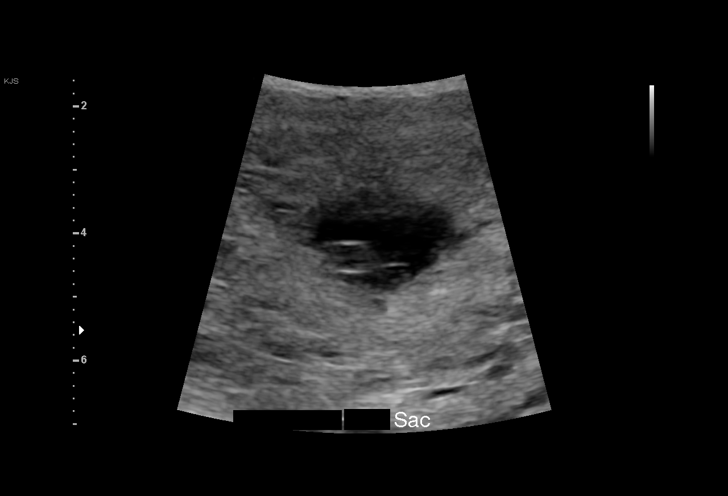
[im 20/39]
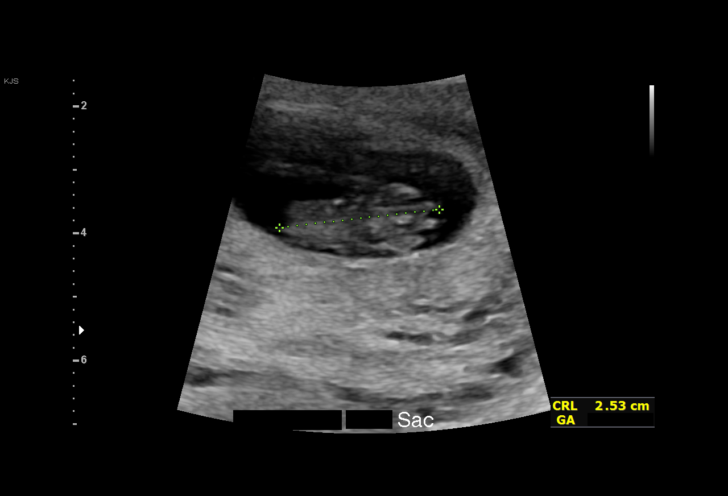
[im 22/39]
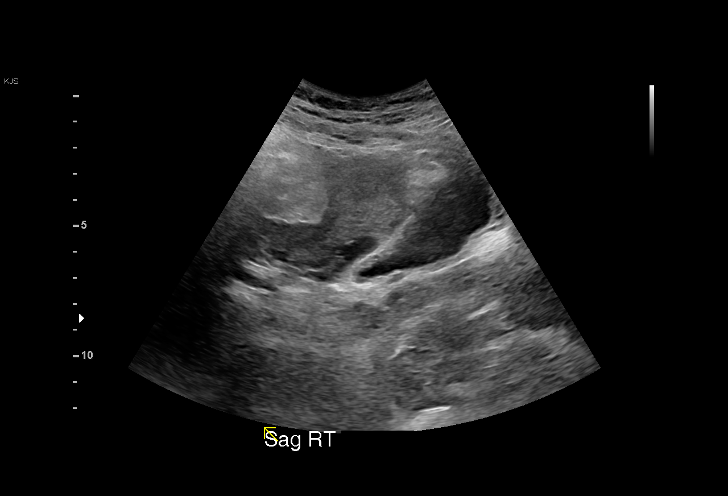
[im 24/39]
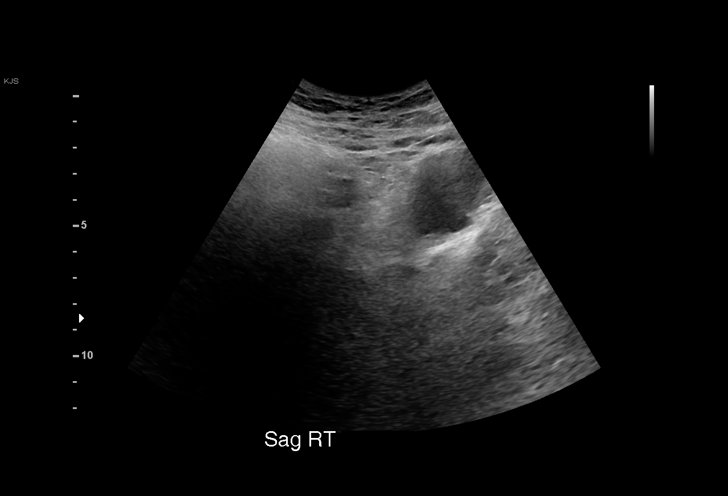
[im 27/39]
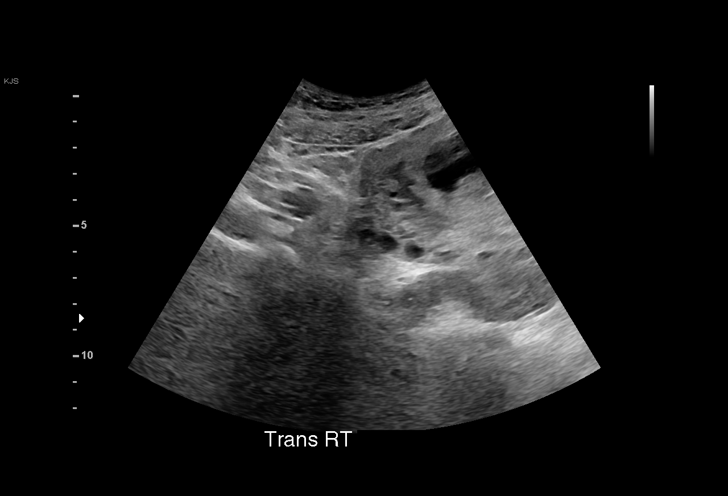
[im 30/39]
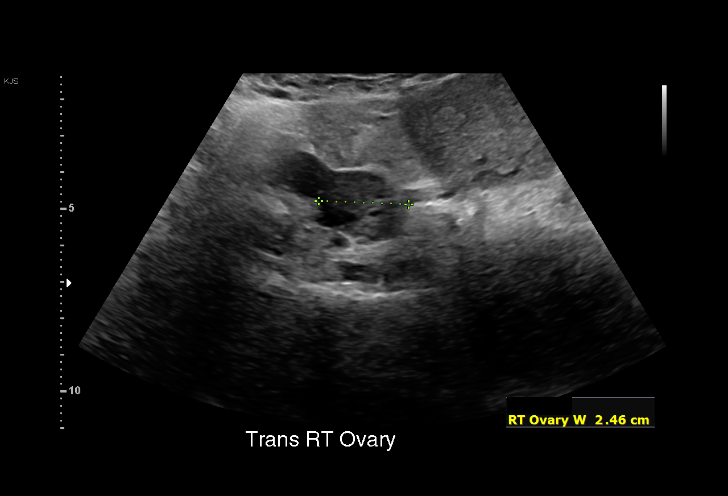
[im 33/39]
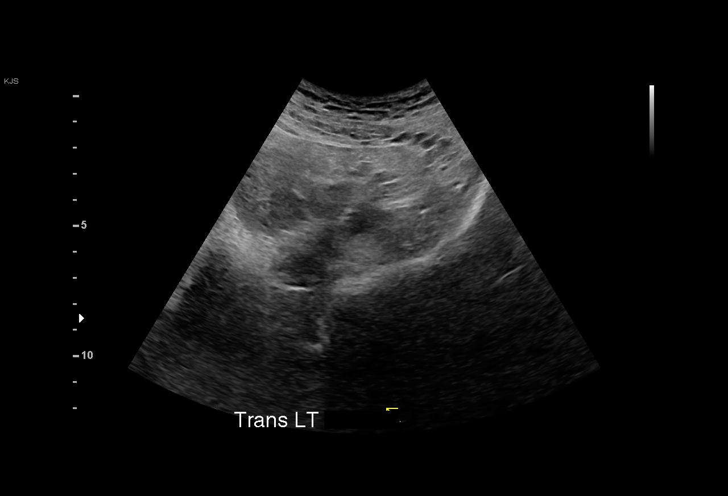
[im 36/39]
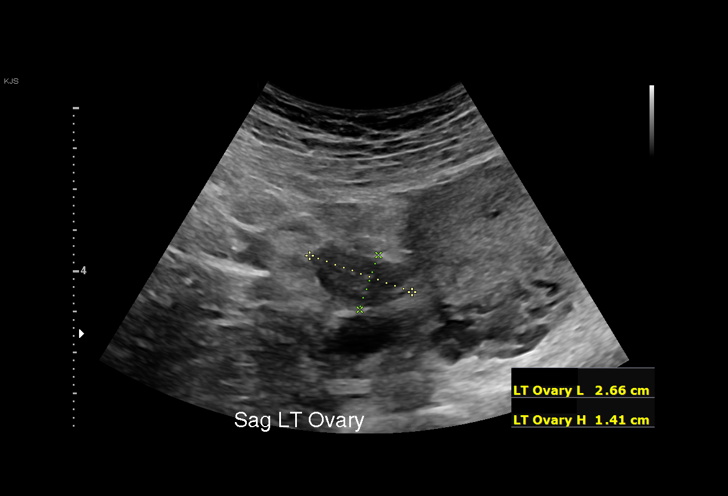
[im 39/39]
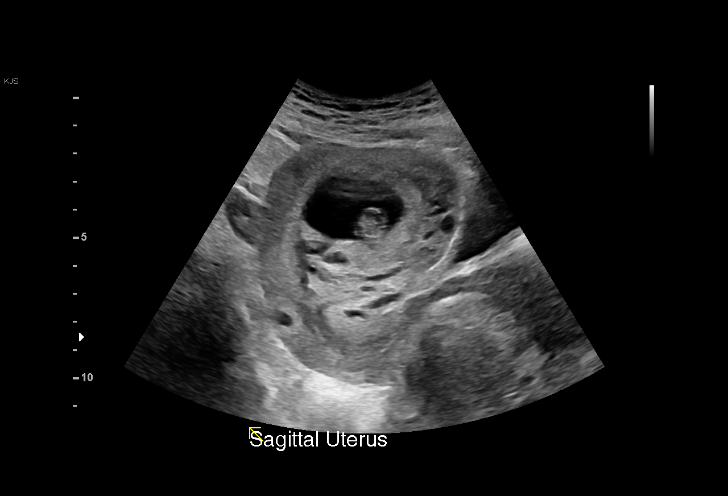

[15 of 28 positions shown; findings below may reference images not displayed]

FINDINGS: Intrauterine gestational sac: Single

Yolk sac:  Visualized.

Embryo:  Visualized.

Cardiac Activity: Visualized.

Heart Rate: 180 bpm

CRL: 24.9 mm   9 w 1 d                  US EDC: 12/08/2021

Subchorionic hemorrhage:  None visualized.

Maternal uterus/adnexae: Left ovary measures 1.3 x 2.7 x 1.4 cm and
the right ovary measures 2.5 x 3.8 x 2.4 cm. No adnexal masses or
free fluid.
IMPRESSION: 1. Single live intrauterine pregnancy as above, estimated age 9
weeks and 1 day.
